# Patient Record
Sex: Female | Born: 1955 | Race: White | Hispanic: No | Marital: Married | Smoking: Never smoker
Health system: Southern US, Community
[De-identification: ages and names within clinical notes are randomized; demographics above are authoritative.]

---

## 2018-05-07 ENCOUNTER — Emergency Department: Payer: PRIVATE HEALTH INSURANCE

## 2018-05-07 ENCOUNTER — Emergency Department
Admission: EM | Admit: 2018-05-07 | Discharge: 2018-05-07 | Disposition: A | Discharge status: Home / Self Care | Payer: PRIVATE HEALTH INSURANCE | Attending: Emergency Medicine | Admitting: Emergency Medicine

## 2018-05-07 ENCOUNTER — Encounter: Payer: Self-pay | Admitting: Emergency Medicine

## 2018-05-07 ENCOUNTER — Other Ambulatory Visit: Payer: Self-pay

## 2018-05-07 DIAGNOSIS — Y999 Unspecified external cause status: Secondary | ICD-10-CM | POA: Diagnosis not present

## 2018-05-07 DIAGNOSIS — S39012A Strain of muscle, fascia and tendon of lower back, initial encounter: Secondary | ICD-10-CM | POA: Insufficient documentation

## 2018-05-07 DIAGNOSIS — Y939 Activity, unspecified: Secondary | ICD-10-CM | POA: Insufficient documentation

## 2018-05-07 DIAGNOSIS — S0990XA Unspecified injury of head, initial encounter: Secondary | ICD-10-CM | POA: Diagnosis present

## 2018-05-07 DIAGNOSIS — S161XXA Strain of muscle, fascia and tendon at neck level, initial encounter: Secondary | ICD-10-CM | POA: Insufficient documentation

## 2018-05-07 DIAGNOSIS — Y92410 Unspecified street and highway as the place of occurrence of the external cause: Secondary | ICD-10-CM | POA: Insufficient documentation

## 2018-05-07 MED ORDER — IBUPROFEN 600 MG PO TABS
600.0000 mg | ORAL_TABLET | Freq: Once | ORAL | Status: AC
  Administered 2018-05-07: 600 mg via ORAL
  Filled 2018-05-07: qty 1

## 2018-05-07 MED ORDER — IBUPROFEN 600 MG PO TABS: 600.0000 mg | ORAL_TABLET | Freq: Three times a day (TID) | ORAL | 0 refills | Status: AC | PRN

## 2018-05-07 MED ORDER — TRAMADOL HCL 50 MG PO TABS
50.0000 mg | ORAL_TABLET | Freq: Once | ORAL | Status: AC
  Administered 2018-05-07: 50 mg via ORAL
  Filled 2018-05-07: qty 1

## 2018-05-07 MED ORDER — TRAMADOL HCL 50 MG PO TABS: 50.0000 mg | ORAL_TABLET | Freq: Two times a day (BID) | ORAL | 0 refills | Status: AC | PRN

## 2018-05-07 MED ORDER — CYCLOBENZAPRINE HCL 10 MG PO TABS: 10.0000 mg | ORAL_TABLET | Freq: Three times a day (TID) | ORAL | 0 refills | Status: AC | PRN

## 2018-05-07 MED ORDER — CYCLOBENZAPRINE HCL 10 MG PO TABS
10.0000 mg | ORAL_TABLET | Freq: Once | ORAL | Status: AC
  Administered 2018-05-07: 10 mg via ORAL
  Filled 2018-05-07: qty 1

## 2018-05-07 NOTE — Discharge Instructions (Signed)
Follow discharge care instruction take medication as directed. °
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
## 2018-05-07 NOTE — ED Notes (Signed)
Discharge instructions and rx's reviewed with pt   States her husband is on the way  Requesting to remain in room until family gets here
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
